# Patient Record
Sex: Female | Born: 1960 | Race: White | Hispanic: No | Marital: Married | Smoking: Former smoker
Health system: Southern US, Community
[De-identification: ages and names within clinical notes are randomized; demographics above are authoritative.]

## PROBLEM LIST (undated history)

## (undated) DIAGNOSIS — E039 Hypothyroidism, unspecified: Secondary | ICD-10-CM

## (undated) DIAGNOSIS — D649 Anemia, unspecified: Secondary | ICD-10-CM

## (undated) DIAGNOSIS — F419 Anxiety disorder, unspecified: Secondary | ICD-10-CM

## (undated) DIAGNOSIS — K649 Unspecified hemorrhoids: Secondary | ICD-10-CM

## (undated) DIAGNOSIS — I10 Essential (primary) hypertension: Secondary | ICD-10-CM

## (undated) DIAGNOSIS — E785 Hyperlipidemia, unspecified: Secondary | ICD-10-CM

## (undated) HISTORY — PX: OTHER SURGICAL HISTORY: SHX169

---

## 2000-08-19 ENCOUNTER — Ambulatory Visit (HOSPITAL_COMMUNITY): Admission: RE | Admit: 2000-08-19 | Discharge: 2000-08-19 | Payer: Self-pay | Admitting: Obstetrics and Gynecology

## 2000-08-19 ENCOUNTER — Encounter: Payer: Self-pay | Admitting: Obstetrics and Gynecology

## 2001-08-27 ENCOUNTER — Encounter: Payer: Self-pay | Admitting: Obstetrics and Gynecology

## 2001-08-27 ENCOUNTER — Ambulatory Visit (HOSPITAL_COMMUNITY): Admission: RE | Admit: 2001-08-27 | Discharge: 2001-08-27 | Payer: Self-pay | Admitting: Obstetrics and Gynecology

## 2002-09-15 ENCOUNTER — Ambulatory Visit (HOSPITAL_COMMUNITY): Admission: RE | Admit: 2002-09-15 | Discharge: 2002-09-15 | Payer: Self-pay | Admitting: Obstetrics & Gynecology

## 2002-09-15 ENCOUNTER — Encounter: Payer: Self-pay | Admitting: Obstetrics and Gynecology

## 2003-09-29 ENCOUNTER — Ambulatory Visit (HOSPITAL_COMMUNITY): Admission: RE | Admit: 2003-09-29 | Discharge: 2003-09-29 | Payer: Self-pay | Admitting: Obstetrics and Gynecology

## 2004-10-08 ENCOUNTER — Ambulatory Visit (HOSPITAL_COMMUNITY): Admission: RE | Admit: 2004-10-08 | Discharge: 2004-10-08 | Payer: Self-pay | Admitting: Obstetrics and Gynecology

## 2005-10-10 ENCOUNTER — Ambulatory Visit (HOSPITAL_COMMUNITY): Admission: RE | Admit: 2005-10-10 | Discharge: 2005-10-10 | Payer: Self-pay | Admitting: Obstetrics and Gynecology

## 2006-10-15 ENCOUNTER — Ambulatory Visit (HOSPITAL_COMMUNITY): Admission: RE | Admit: 2006-10-15 | Discharge: 2006-10-15 | Payer: Self-pay | Admitting: Family Medicine

## 2006-12-31 ENCOUNTER — Ambulatory Visit (HOSPITAL_COMMUNITY): Admission: RE | Admit: 2006-12-31 | Discharge: 2006-12-31 | Payer: Self-pay | Admitting: Family Medicine

## 2007-04-10 ENCOUNTER — Ambulatory Visit (HOSPITAL_COMMUNITY): Admission: RE | Admit: 2007-04-10 | Discharge: 2007-04-10 | Payer: Self-pay | Admitting: Family Medicine

## 2007-10-19 ENCOUNTER — Ambulatory Visit (HOSPITAL_COMMUNITY): Admission: RE | Admit: 2007-10-19 | Discharge: 2007-10-19 | Payer: Self-pay | Admitting: Family Medicine

## 2008-10-19 ENCOUNTER — Ambulatory Visit (HOSPITAL_COMMUNITY): Admission: RE | Admit: 2008-10-19 | Discharge: 2008-10-19 | Payer: Self-pay | Admitting: Family Medicine

## 2009-10-23 ENCOUNTER — Ambulatory Visit (HOSPITAL_COMMUNITY): Admission: RE | Admit: 2009-10-23 | Discharge: 2009-10-23 | Payer: Self-pay | Admitting: Family Medicine

## 2009-11-01 HISTORY — PX: COLONOSCOPY: SHX5424

## 2010-12-23 ENCOUNTER — Encounter: Payer: Self-pay | Admitting: Family Medicine

## 2011-01-09 ENCOUNTER — Other Ambulatory Visit (HOSPITAL_COMMUNITY): Payer: Self-pay | Admitting: Family Medicine

## 2011-01-09 DIAGNOSIS — Z1231 Encounter for screening mammogram for malignant neoplasm of breast: Secondary | ICD-10-CM

## 2011-02-14 ENCOUNTER — Ambulatory Visit (HOSPITAL_COMMUNITY)
Admission: RE | Admit: 2011-02-14 | Discharge: 2011-02-14 | Disposition: A | Discharge status: Home / Self Care | Payer: 59 | Source: Ambulatory Visit | Attending: Family Medicine | Admitting: Family Medicine

## 2011-02-14 DIAGNOSIS — Z1231 Encounter for screening mammogram for malignant neoplasm of breast: Secondary | ICD-10-CM | POA: Insufficient documentation

## 2011-02-15 ENCOUNTER — Other Ambulatory Visit: Payer: Self-pay | Admitting: Family Medicine

## 2011-02-15 DIAGNOSIS — R928 Other abnormal and inconclusive findings on diagnostic imaging of breast: Secondary | ICD-10-CM

## 2011-02-18 ENCOUNTER — Ambulatory Visit (HOSPITAL_COMMUNITY): Payer: Self-pay

## 2011-02-22 ENCOUNTER — Ambulatory Visit
Admission: RE | Admit: 2011-02-22 | Discharge: 2011-02-22 | Disposition: A | Discharge status: Home / Self Care | Payer: 59 | Source: Ambulatory Visit | Attending: Family Medicine | Admitting: Family Medicine

## 2011-02-22 DIAGNOSIS — R928 Other abnormal and inconclusive findings on diagnostic imaging of breast: Secondary | ICD-10-CM

## 2012-03-04 ENCOUNTER — Other Ambulatory Visit: Payer: Self-pay | Admitting: Family Medicine

## 2012-03-04 DIAGNOSIS — Z1231 Encounter for screening mammogram for malignant neoplasm of breast: Secondary | ICD-10-CM

## 2012-03-16 ENCOUNTER — Ambulatory Visit
Admission: RE | Admit: 2012-03-16 | Discharge: 2012-03-16 | Disposition: A | Discharge status: Home / Self Care | Payer: 59 | Source: Ambulatory Visit | Attending: Family Medicine | Admitting: Family Medicine

## 2012-03-16 DIAGNOSIS — Z1231 Encounter for screening mammogram for malignant neoplasm of breast: Secondary | ICD-10-CM

## 2013-02-12 ENCOUNTER — Other Ambulatory Visit: Payer: Self-pay

## 2013-02-12 DIAGNOSIS — Z1231 Encounter for screening mammogram for malignant neoplasm of breast: Secondary | ICD-10-CM

## 2013-03-17 ENCOUNTER — Ambulatory Visit
Admission: RE | Admit: 2013-03-17 | Discharge: 2013-03-17 | Disposition: A | Discharge status: Home / Self Care | Payer: 59 | Source: Ambulatory Visit

## 2013-03-17 DIAGNOSIS — Z1231 Encounter for screening mammogram for malignant neoplasm of breast: Secondary | ICD-10-CM

## 2014-02-15 ENCOUNTER — Other Ambulatory Visit: Payer: Self-pay

## 2014-02-15 DIAGNOSIS — Z1231 Encounter for screening mammogram for malignant neoplasm of breast: Secondary | ICD-10-CM

## 2014-03-18 ENCOUNTER — Ambulatory Visit
Admission: RE | Admit: 2014-03-18 | Discharge: 2014-03-18 | Disposition: A | Discharge status: Home / Self Care | Payer: 59 | Source: Ambulatory Visit

## 2014-03-18 DIAGNOSIS — Z1231 Encounter for screening mammogram for malignant neoplasm of breast: Secondary | ICD-10-CM

## 2015-02-15 ENCOUNTER — Other Ambulatory Visit: Payer: Self-pay

## 2015-02-15 DIAGNOSIS — Z1231 Encounter for screening mammogram for malignant neoplasm of breast: Secondary | ICD-10-CM

## 2015-03-29 ENCOUNTER — Ambulatory Visit
Admission: RE | Admit: 2015-03-29 | Discharge: 2015-03-29 | Disposition: A | Discharge status: Home / Self Care | Payer: 59 | Source: Ambulatory Visit

## 2015-03-29 DIAGNOSIS — Z1231 Encounter for screening mammogram for malignant neoplasm of breast: Secondary | ICD-10-CM

## 2016-02-27 ENCOUNTER — Other Ambulatory Visit: Payer: Self-pay

## 2016-02-27 DIAGNOSIS — Z1231 Encounter for screening mammogram for malignant neoplasm of breast: Secondary | ICD-10-CM

## 2016-04-02 ENCOUNTER — Ambulatory Visit
Admission: RE | Admit: 2016-04-02 | Discharge: 2016-04-02 | Disposition: A | Discharge status: Home / Self Care | Payer: 59 | Source: Ambulatory Visit

## 2016-04-02 DIAGNOSIS — Z1231 Encounter for screening mammogram for malignant neoplasm of breast: Secondary | ICD-10-CM

## 2017-03-12 ENCOUNTER — Other Ambulatory Visit: Payer: Self-pay | Admitting: Family Medicine

## 2017-03-12 DIAGNOSIS — Z1231 Encounter for screening mammogram for malignant neoplasm of breast: Secondary | ICD-10-CM

## 2017-04-09 ENCOUNTER — Ambulatory Visit
Admission: RE | Admit: 2017-04-09 | Discharge: 2017-04-09 | Disposition: A | Discharge status: Home / Self Care | Payer: PRIVATE HEALTH INSURANCE | Source: Ambulatory Visit | Attending: Family Medicine | Admitting: Family Medicine

## 2017-04-09 DIAGNOSIS — Z1231 Encounter for screening mammogram for malignant neoplasm of breast: Secondary | ICD-10-CM

## 2018-03-11 ENCOUNTER — Other Ambulatory Visit: Payer: Self-pay | Admitting: Family Medicine

## 2018-03-11 DIAGNOSIS — Z1231 Encounter for screening mammogram for malignant neoplasm of breast: Secondary | ICD-10-CM

## 2018-04-10 ENCOUNTER — Ambulatory Visit
Admission: RE | Admit: 2018-04-10 | Discharge: 2018-04-10 | Disposition: A | Discharge status: Home / Self Care | Payer: PRIVATE HEALTH INSURANCE | Source: Ambulatory Visit | Attending: Family Medicine | Admitting: Family Medicine

## 2018-04-10 DIAGNOSIS — Z1231 Encounter for screening mammogram for malignant neoplasm of breast: Secondary | ICD-10-CM

## 2019-04-05 ENCOUNTER — Other Ambulatory Visit: Payer: Self-pay | Admitting: Family Medicine

## 2019-04-05 DIAGNOSIS — Z1231 Encounter for screening mammogram for malignant neoplasm of breast: Secondary | ICD-10-CM

## 2019-06-16 ENCOUNTER — Other Ambulatory Visit: Payer: Self-pay

## 2019-06-16 ENCOUNTER — Ambulatory Visit
Admission: RE | Admit: 2019-06-16 | Discharge: 2019-06-16 | Disposition: A | Discharge status: Home / Self Care | Payer: PRIVATE HEALTH INSURANCE | Source: Ambulatory Visit | Attending: Family Medicine | Admitting: Family Medicine

## 2019-06-16 DIAGNOSIS — Z1231 Encounter for screening mammogram for malignant neoplasm of breast: Secondary | ICD-10-CM

## 2020-05-11 ENCOUNTER — Other Ambulatory Visit: Payer: Self-pay

## 2020-05-11 ENCOUNTER — Ambulatory Visit
Admission: RE | Admit: 2020-05-11 | Discharge: 2020-05-11 | Disposition: A | Discharge status: Home / Self Care | Payer: PRIVATE HEALTH INSURANCE | Source: Ambulatory Visit | Attending: Sports Medicine | Admitting: Sports Medicine

## 2020-05-11 ENCOUNTER — Other Ambulatory Visit: Payer: Self-pay | Admitting: Sports Medicine

## 2020-05-11 DIAGNOSIS — M25532 Pain in left wrist: Secondary | ICD-10-CM

## 2020-05-12 ENCOUNTER — Other Ambulatory Visit (HOSPITAL_COMMUNITY)
Admission: RE | Admit: 2020-05-12 | Discharge: 2020-05-12 | Disposition: A | Discharge status: Home / Self Care | Payer: PRIVATE HEALTH INSURANCE | Source: Ambulatory Visit | Attending: Orthopaedic Surgery | Admitting: Orthopaedic Surgery

## 2020-05-12 ENCOUNTER — Other Ambulatory Visit: Payer: Self-pay

## 2020-05-12 ENCOUNTER — Encounter (HOSPITAL_COMMUNITY): Payer: Self-pay | Admitting: Orthopaedic Surgery

## 2020-05-12 DIAGNOSIS — Z01812 Encounter for preprocedural laboratory examination: Secondary | ICD-10-CM | POA: Diagnosis not present

## 2020-05-12 DIAGNOSIS — Z20822 Contact with and (suspected) exposure to covid-19: Secondary | ICD-10-CM | POA: Insufficient documentation

## 2020-05-12 LAB — SARS CORONAVIRUS 2 (TAT 6-24 HRS): SARS Coronavirus 2: NEGATIVE

## 2020-05-12 NOTE — Progress Notes (Addendum)
Patient denies shortness of breath, fever, cough or chest pain.  PCP - Lenox Ahr, PA-C Cardiologist - n/a  Chest x-ray - n/a EKG - 05/13/20 Stress Test - n/a ECHO - n/a Cardiac Cath - n/a  ERAS: Clears til 6:45 am, no drink.  STOP now taking any Aspirin (unless otherwise instructed by your surgeon), Aleve, Naproxen, Ibuprofen, Motrin, Advil, Goody's, BC's, all herbal medications, fish oil, and all vitamins.   Coronavirus Screening Covid test scheduled 05/12/20 Do you have any of the following symptoms:  Cough yes/no: No Fever (>100.50F)  yes/no: No Runny nose yes/no: No Sore throat yes/no: No Difficulty breathing/shortness of breath  yes/no: No  Have you traveled in the last 14 days and where? yes/no: No  Patient verbalized understanding of instructions that were given via phone.

## 2020-05-13 ENCOUNTER — Ambulatory Visit (HOSPITAL_COMMUNITY)
Admission: RE | Admit: 2020-05-13 | Discharge: 2020-05-13 | Disposition: A | Discharge status: Home / Self Care | Payer: PRIVATE HEALTH INSURANCE | Attending: Orthopaedic Surgery | Admitting: Orthopaedic Surgery

## 2020-05-13 ENCOUNTER — Encounter (HOSPITAL_COMMUNITY)
Admission: RE | Disposition: A | Discharge status: Home / Self Care | Payer: Self-pay | Source: Home / Self Care | Attending: Orthopaedic Surgery

## 2020-05-13 ENCOUNTER — Ambulatory Visit (HOSPITAL_COMMUNITY): Payer: PRIVATE HEALTH INSURANCE | Admitting: Certified Registered"

## 2020-05-13 ENCOUNTER — Encounter (HOSPITAL_COMMUNITY): Payer: Self-pay | Admitting: Orthopaedic Surgery

## 2020-05-13 ENCOUNTER — Ambulatory Visit (HOSPITAL_COMMUNITY): Payer: PRIVATE HEALTH INSURANCE

## 2020-05-13 DIAGNOSIS — S52562A Barton's fracture of left radius, initial encounter for closed fracture: Secondary | ICD-10-CM | POA: Diagnosis not present

## 2020-05-13 DIAGNOSIS — F419 Anxiety disorder, unspecified: Secondary | ICD-10-CM | POA: Diagnosis not present

## 2020-05-13 DIAGNOSIS — E785 Hyperlipidemia, unspecified: Secondary | ICD-10-CM | POA: Insufficient documentation

## 2020-05-13 DIAGNOSIS — I10 Essential (primary) hypertension: Secondary | ICD-10-CM | POA: Diagnosis not present

## 2020-05-13 DIAGNOSIS — E039 Hypothyroidism, unspecified: Secondary | ICD-10-CM | POA: Insufficient documentation

## 2020-05-13 DIAGNOSIS — Z79890 Hormone replacement therapy (postmenopausal): Secondary | ICD-10-CM | POA: Insufficient documentation

## 2020-05-13 DIAGNOSIS — Z79899 Other long term (current) drug therapy: Secondary | ICD-10-CM | POA: Diagnosis not present

## 2020-05-13 DIAGNOSIS — W19XXXA Unspecified fall, initial encounter: Secondary | ICD-10-CM | POA: Diagnosis not present

## 2020-05-13 DIAGNOSIS — Z87891 Personal history of nicotine dependence: Secondary | ICD-10-CM | POA: Diagnosis not present

## 2020-05-13 HISTORY — DX: Anxiety disorder, unspecified: F41.9

## 2020-05-13 HISTORY — PX: ORIF WRIST FRACTURE: SHX2133

## 2020-05-13 HISTORY — DX: Essential (primary) hypertension: I10

## 2020-05-13 HISTORY — DX: Hypothyroidism, unspecified: E03.9

## 2020-05-13 HISTORY — DX: Unspecified hemorrhoids: K64.9

## 2020-05-13 HISTORY — DX: Hyperlipidemia, unspecified: E78.5

## 2020-05-13 HISTORY — DX: Anemia, unspecified: D64.9

## 2020-05-13 LAB — COMPREHENSIVE METABOLIC PANEL WITH GFR
ALT: 15 U/L (ref 0–44)
AST: 20 U/L (ref 15–41)
Albumin: 4 g/dL (ref 3.5–5.0)
Alkaline Phosphatase: 92 U/L (ref 38–126)
Anion gap: 11 (ref 5–15)
BUN: 16 mg/dL (ref 8–23)
CO2: 24 mmol/L (ref 22–32)
Calcium: 9.3 mg/dL (ref 8.9–10.3)
Chloride: 101 mmol/L (ref 98–111)
Creatinine, Ser: 1.1 mg/dL — ABNORMAL HIGH (ref 0.44–1.00)
GFR calc Af Amer: >60 mL/min
GFR calc non Af Amer: 54 mL/min — ABNORMAL LOW
Glucose, Bld: 104 mg/dL — ABNORMAL HIGH (ref 70–99)
Potassium: 3.5 mmol/L (ref 3.5–5.1)
Sodium: 136 mmol/L (ref 135–145)
Total Bilirubin: 0.7 mg/dL (ref 0.3–1.2)
Total Protein: 6.4 g/dL — ABNORMAL LOW (ref 6.5–8.1)

## 2020-05-13 LAB — CBC
HCT: 33.6 % — ABNORMAL LOW (ref 36.0–46.0)
Hemoglobin: 10.9 g/dL — ABNORMAL LOW (ref 12.0–15.0)
MCH: 32.1 pg (ref 26.0–34.0)
MCHC: 32.4 g/dL (ref 30.0–36.0)
MCV: 98.8 fL (ref 80.0–100.0)
Platelets: 257 K/uL (ref 150–400)
RBC: 3.4 MIL/uL — ABNORMAL LOW (ref 3.87–5.11)
RDW: 11.6 % (ref 11.5–15.5)
WBC: 6.7 K/uL (ref 4.0–10.5)
nRBC: 0 % (ref 0.0–0.2)

## 2020-05-13 SURGERY — OPEN REDUCTION INTERNAL FIXATION (ORIF) WRIST FRACTURE
Anesthesia: Monitor Anesthesia Care | Site: Wrist | Laterality: Left

## 2020-05-13 MED ORDER — ONDANSETRON HCL 4 MG/2ML IJ SOLN
INTRAMUSCULAR | Status: DC | PRN
Start: 2020-05-13 — End: 2020-05-13
  Administered 2020-05-13: 4 mg via INTRAVENOUS

## 2020-05-13 MED ORDER — PHENYLEPHRINE 40 MCG/ML (10ML) SYRINGE FOR IV PUSH (FOR BLOOD PRESSURE SUPPORT)
PREFILLED_SYRINGE | INTRAVENOUS | Status: DC | PRN
  Administered 2020-05-13 (×6): 40 ug via INTRAVENOUS

## 2020-05-13 MED ORDER — ORAL CARE MOUTH RINSE: 15.0000 mL | Freq: Once | OROMUCOSAL | Status: AC

## 2020-05-13 MED ORDER — MIDAZOLAM HCL (PF) 2 MG/2ML IJ SOLN: 2.0000 mg | Freq: Once | INTRAMUSCULAR | Status: AC

## 2020-05-13 MED ORDER — FENTANYL CITRATE (PF) 100 MCG/2ML IJ SOLN: 100.0000 ug | Freq: Once | INTRAMUSCULAR | Status: AC

## 2020-05-13 MED ORDER — MIDAZOLAM HCL 5 MG/5ML IJ SOLN
INTRAMUSCULAR | Status: DC | PRN
Start: 2020-05-13 — End: 2020-05-13
  Administered 2020-05-13: .5 mg via INTRAVENOUS
  Administered 2020-05-13: 1 mg via INTRAVENOUS
  Administered 2020-05-13: .5 mg via INTRAVENOUS

## 2020-05-13 MED ORDER — DEXAMETHASONE SODIUM PHOSPHATE 10 MG/ML IJ SOLN
INTRAMUSCULAR | Status: DC | PRN
Start: 2020-05-13 — End: 2020-05-13
  Administered 2020-05-13: 4 mg via INTRAVENOUS

## 2020-05-13 MED ORDER — CHLORHEXIDINE GLUCONATE 0.12 % MT SOLN: 15.0000 mL | Freq: Once | OROMUCOSAL | Status: AC

## 2020-05-13 MED ORDER — CEFAZOLIN SODIUM-DEXTROSE 2-4 GM/100ML-% IV SOLN
INTRAVENOUS | Status: AC
  Filled 2020-05-13: qty 100

## 2020-05-13 MED ORDER — LACTATED RINGERS IV SOLN: INTRAVENOUS | Status: DC

## 2020-05-13 MED ORDER — POVIDONE-IODINE 10 % EX SWAB: 2.0000 "application " | Freq: Once | CUTANEOUS | Status: DC

## 2020-05-13 MED ORDER — FENTANYL CITRATE (PF) 250 MCG/5ML IJ SOLN
INTRAMUSCULAR | Status: AC
  Filled 2020-05-13: qty 5

## 2020-05-13 MED ORDER — 0.9 % SODIUM CHLORIDE (POUR BTL) OPTIME
TOPICAL | Status: DC | PRN
  Administered 2020-05-13: 1000 mL

## 2020-05-13 MED ORDER — PHENYLEPHRINE 40 MCG/ML (10ML) SYRINGE FOR IV PUSH (FOR BLOOD PRESSURE SUPPORT)
PREFILLED_SYRINGE | INTRAVENOUS | Status: AC
  Filled 2020-05-13: qty 10

## 2020-05-13 MED ORDER — CHLORHEXIDINE GLUCONATE 4 % EX LIQD: 60.0000 mL | Freq: Once | CUTANEOUS | Status: DC

## 2020-05-13 MED ORDER — MIDAZOLAM HCL 2 MG/2ML IJ SOLN
INTRAMUSCULAR | Status: AC
  Administered 2020-05-13: 2 mg via INTRAVENOUS
  Filled 2020-05-13: qty 2

## 2020-05-13 MED ORDER — MIDAZOLAM HCL 2 MG/2ML IJ SOLN
INTRAMUSCULAR | Status: AC
  Filled 2020-05-13: qty 2

## 2020-05-13 MED ORDER — PROPOFOL 500 MG/50ML IV EMUL
INTRAVENOUS | Status: DC | PRN
Start: 2020-05-13 — End: 2020-05-13
  Administered 2020-05-13: 100 ug/kg/min via INTRAVENOUS

## 2020-05-13 MED ORDER — DEXAMETHASONE SODIUM PHOSPHATE 10 MG/ML IJ SOLN
INTRAMUSCULAR | Status: AC
  Filled 2020-05-13: qty 1

## 2020-05-13 MED ORDER — ONDANSETRON HCL 4 MG/2ML IJ SOLN
INTRAMUSCULAR | Status: AC
  Filled 2020-05-13: qty 2

## 2020-05-13 MED ORDER — CEFAZOLIN SODIUM-DEXTROSE 2-4 GM/100ML-% IV SOLN
2.0000 g | INTRAVENOUS | Status: AC
  Administered 2020-05-13: 2 g via INTRAVENOUS

## 2020-05-13 MED ORDER — CHLORHEXIDINE GLUCONATE 0.12 % MT SOLN
OROMUCOSAL | Status: AC
  Administered 2020-05-13: 15 mL via OROMUCOSAL
  Filled 2020-05-13: qty 15

## 2020-05-13 MED ORDER — FENTANYL CITRATE (PF) 100 MCG/2ML IJ SOLN
INTRAMUSCULAR | Status: AC
  Administered 2020-05-13: 100 ug via INTRAVENOUS
  Filled 2020-05-13: qty 2

## 2020-05-13 SURGICAL SUPPLY — 60 items
ALCOHOL 70% 16 OZ (MISCELLANEOUS) ×4 IMPLANT
APL PRP STRL LF DISP 70% ISPRP (MISCELLANEOUS) ×1
BIT DRILL 2.0 LNG QUCK RELEASE (BIT) IMPLANT
BIT DRILL 2.8 QUICK RELEASE (BIT) IMPLANT
BNDG CMPR 9X4 STRL LF SNTH (GAUZE/BANDAGES/DRESSINGS) ×1
BNDG ELASTIC 3X5.8 VLCR STR LF (GAUZE/BANDAGES/DRESSINGS) ×3 IMPLANT
BNDG ELASTIC 4X5.8 VLCR STR LF (GAUZE/BANDAGES/DRESSINGS) ×3 IMPLANT
BNDG ESMARK 4X9 LF (GAUZE/BANDAGES/DRESSINGS) ×3 IMPLANT
BNDG GAUZE ELAST 4 BULKY (GAUZE/BANDAGES/DRESSINGS) ×3 IMPLANT
CANISTER SUCT 3000ML PPV (MISCELLANEOUS) ×3 IMPLANT
CHLORAPREP W/TINT 26 (MISCELLANEOUS) ×3 IMPLANT
CORD BIPOLAR FORCEPS 12FT (ELECTRODE) ×3 IMPLANT
COVER SURGICAL LIGHT HANDLE (MISCELLANEOUS) ×3 IMPLANT
COVER WAND RF STERILE (DRAPES) IMPLANT
CUFF TOURN SGL QUICK 18X4 (TOURNIQUET CUFF) ×3 IMPLANT
CUFF TOURN SGL QUICK 24 (TOURNIQUET CUFF)
CUFF TRNQT CYL 24X4X16.5-23 (TOURNIQUET CUFF) IMPLANT
DRAPE OEC MINIVIEW 54X84 (DRAPES) ×3 IMPLANT
DRAPE SURG 17X23 STRL (DRAPES) ×3 IMPLANT
DRILL 2.0 LNG QUICK RELEASE (BIT) ×3
DRILL 2.8 QUICK RELEASE (BIT) ×3
GAUZE SPONGE 4X4 12PLY STRL (GAUZE/BANDAGES/DRESSINGS) ×3 IMPLANT
GAUZE SPONGE 4X4 12PLY STRL LF (GAUZE/BANDAGES/DRESSINGS) ×2 IMPLANT
GAUZE XEROFORM 1X8 LF (GAUZE/BANDAGES/DRESSINGS) ×2 IMPLANT
GLOVE INDICATOR 8.0 STRL GRN (GLOVE) ×3 IMPLANT
GLOVE SURG SYN 7.5  E (GLOVE) ×3
GLOVE SURG SYN 7.5 E (GLOVE) ×1 IMPLANT
GLOVE SURG SYN 7.5 PF PI (GLOVE) ×1 IMPLANT
GOWN STRL REUS W/ TWL LRG LVL3 (GOWN DISPOSABLE) ×2 IMPLANT
GOWN STRL REUS W/TWL LRG LVL3 (GOWN DISPOSABLE) ×6
GUIDEWIRE ORTHO 0.054X6 (WIRE) ×4 IMPLANT
KIT BASIN OR (CUSTOM PROCEDURE TRAY) ×3 IMPLANT
KIT TURNOVER KIT B (KITS) ×3 IMPLANT
NEEDLE 22X1 1/2 (OR ONLY) (NEEDLE) IMPLANT
NS IRRIG 1000ML POUR BTL (IV SOLUTION) ×3 IMPLANT
PACK ORTHO EXTREMITY (CUSTOM PROCEDURE TRAY) ×3 IMPLANT
PAD ARMBOARD 7.5X6 YLW CONV (MISCELLANEOUS) ×6 IMPLANT
PAD CAST 3X4 CTTN HI CHSV (CAST SUPPLIES) ×1 IMPLANT
PAD CAST 4YDX4 CTTN HI CHSV (CAST SUPPLIES) ×1 IMPLANT
PADDING CAST COTTON 3X4 STRL (CAST SUPPLIES) ×3
PADDING CAST COTTON 4X4 STRL (CAST SUPPLIES) ×3
PLATE LEFT DIST RADIUS NARROW (Plate) ×2 IMPLANT
SCREW BN FT 16X2.3XLCK HEX CRT (Screw) IMPLANT
SCREW CORT FT 18X2.3XLCK HEX (Screw) IMPLANT
SCREW CORTICAL LOCKING 2.3X16M (Screw) ×3 IMPLANT
SCREW CORTICAL LOCKING 2.3X18M (Screw) ×12 IMPLANT
SCREW LOCK 12X3.5X HEXALOBE (Screw) IMPLANT
SCREW LOCKING 3.5X12 (Screw) ×3 IMPLANT
SCREW NON LOCK 3.5X10MM (Screw) ×2 IMPLANT
SCREW NON TOGG 2.3X22MM (Screw) ×2 IMPLANT
SPLINT FIBERGLASS 3X12 (CAST SUPPLIES) ×2 IMPLANT
SUT PROLENE 4 0 PS 2 18 (SUTURE) ×2 IMPLANT
SUT VIC AB 3-0 PS2 18 (SUTURE) IMPLANT
SYR CONTROL 10ML LL (SYRINGE) IMPLANT
TOWEL GREEN STERILE (TOWEL DISPOSABLE) ×3 IMPLANT
TOWEL GREEN STERILE FF (TOWEL DISPOSABLE) ×3 IMPLANT
TUBE CONNECTING 12'X1/4 (SUCTIONS) ×1
TUBE CONNECTING 12X1/4 (SUCTIONS) ×2 IMPLANT
UNDERPAD 30X36 HEAVY ABSORB (UNDERPADS AND DIAPERS) ×3 IMPLANT
WATER STERILE IRR 1000ML POUR (IV SOLUTION) ×3 IMPLANT

## 2020-05-13 NOTE — H&P (Signed)
ORTHOPAEDIC H&P  PCP:  Aletha Halim., PA-C  Chief Complaint: Left distal radius fracture  HPI: Beverly Diaz is a 59 y.o. female who complains of left distal radius fracture.  Earlier this week she fell onto the left side and had immediate pain and swelling.  She was seen by my partner Dr. Delilah Shan earlier this week who found a mildly displaced intra-articular left distal radius fracture.  She underwent a CT scan of the left wrist and was then seen by me in clinic yesterday.  The CT scan showed a volar Barton's fracture with diastases of the intra-articular fracture line and volar translation of the carpus.  We had a long discussion regarding treatment options including both operative and nonoperative management.  After having this discussion the patient did wish to proceed forward with surgical intervention and presents today for that.  She denies numbness or tingling in the fingers.  Her pain is been well controlled.  She is remained n.p.o.  Past Medical History:  Diagnosis Date  . Anemia   . Anxiety   . Hemorrhoids   . HLD (hyperlipidemia)   . Hypertension   . Hypothyroidism    Past Surgical History:  Procedure Laterality Date  . COLONOSCOPY  11/2009   anal fissure and hemorrhoids  . wisdom teeth ext     Social History   Socioeconomic History  . Marital status: Married    Spouse name: Not on file  . Number of children: Not on file  . Years of education: Not on file  . Highest education level: Not on file  Occupational History  . Not on file  Tobacco Use  . Smoking status: Former Smoker    Types: Cigarettes  . Smokeless tobacco: Never Used  . Tobacco comment: quit at age 32 - weekend smoker  Vaping Use  . Vaping Use: Never used  Substance and Sexual Activity  . Alcohol use: Yes    Alcohol/week: 21.0 standard drinks    Types: 21 Glasses of wine per week  . Drug use: Never  . Sexual activity: Yes    Birth control/protection: Post-menopausal  Other Topics  Concern  . Not on file  Social History Narrative  . Not on file   Social Determinants of Health   Financial Resource Strain:   . Difficulty of Paying Living Expenses:   Food Insecurity:   . Worried About Charity fundraiser in the Last Year:   . Arboriculturist in the Last Year:   Transportation Needs:   . Film/video editor (Medical):   Marland Kitchen Lack of Transportation (Non-Medical):   Physical Activity:   . Days of Exercise per Week:   . Minutes of Exercise per Session:   Stress:   . Feeling of Stress :   Social Connections:   . Frequency of Communication with Friends and Family:   . Frequency of Social Gatherings with Friends and Family:   . Attends Religious Services:   . Active Member of Clubs or Organizations:   . Attends Archivist Meetings:   Marland Kitchen Marital Status:    History reviewed. No pertinent family history. No Known Allergies Prior to Admission medications   Medication Sig Start Date End Date Taking? Authorizing Provider  levothyroxine (SYNTHROID) 25 MCG tablet Take 25 mcg by mouth daily. 04/11/20  Yes [provider]  LORazepam (ATIVAN) 0.5 MG tablet Take 0.25 mg by mouth daily as needed for anxiety.   Yes [provider]  losartan (COZAAR) 25 MG tablet Take 25 mg by mouth daily.   Yes [provider]  metroNIDAZOLE (METROCREAM) 0.75 % cream Apply 1 application topically daily as needed (Rosacea).   Yes [provider]  Multiple Vitamins-Minerals (MULTIVITAMIN WITH MINERALS) tablet Take 1 tablet by mouth daily.   Yes [provider]  rosuvastatin (CRESTOR) 20 MG tablet Take 20 mg by mouth daily.   Yes [provider]  HYDROcodone-acetaminophen (NORCO/VICODIN) 5-325 MG tablet Take 1 tablet by mouth every 6 (six) hours as needed for moderate pain.    [provider]   CT WRIST LEFT WO CONTRAST  Result Date: 05/11/2020 CLINICAL DATA:  Left wrist fracture. EXAM: CT OF THE LEFT WRIST WITHOUT CONTRAST  TECHNIQUE: Multidetector CT imaging was performed according to the standard protocol. Multiplanar CT image reconstructions were also generated. COMPARISON:  None. FINDINGS: Bones/Joint/Cartilage Acute intra-articular fracture of the distal radius with 2 mm volar displacement, 83mm impaction, and 1 mm articular surface step-off. No additional fracture. No dislocation. 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 Small radiocarpal joint effusion. Ligaments Ligaments are suboptimally evaluated by CT. Muscles and Tendons Grossly intact.  No muscle atrophy. Soft tissue Mild soft tissue swelling. No fluid collection or hematoma. No soft tissue mass. IMPRESSION: 1. Acute minimally displaced and impacted intra-articular fracture of the distal radius. 2. 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 Electronically Signed   By: Obie Dredge M.D.   On: 05/11/2020 09:14    Positive ROS: All other systems have been reviewed and were otherwise negative with the exception of those mentioned in the HPI and as above.  Physical Exam: General: Alert, no acute distress Cardiovascular: No pedal edema Respiratory: No cyanosis, no use of accessory musculature Skin: No lesions in the area of chief complaint Psychiatric: Patient is competent for consent with normal mood and affect  MUSCULOSKELETAL: Examination of left upper extremity shows a intact volar wrist splint. There is mild swelling to exposed digits as well as to the hand deep to the splint. She has tenderness palpation through the splint to the distal radius. No tenderness to the distal ulna. Her finger range of motion is limited by the splint but she does have gentle flexion extension of the fingers. Her fingertips are all warm well perfused with brisk capillary refill. She does have intact sensation throughout all fingertips.  Assessment: #1 left distal radius volar Barton's fracture  Plan: Plan  to proceed forward with open reduction and internal fixation of the left distal radius.  Risks, benefits and alternatives of the procedure were discussed with the patient once again.  These risks include but are not limited to infection, bleeding, damage to surrounding structures including blood vessels and nerves, pain, stiffness, malunion, nonunion, implant failure and need for additional procedures.  Informed consent was obtained that time.  The patient's left upper extremity was marked.  Plan for discharge home postoperatively with follow-up with therapy next week to transition her from her surgical dressing to a removable wrist splint.  Follow-up with me in approximate 10 to 14 days.    Ernest Mallick, MD [phone]   05/13/2020 7:31 AM

## 2020-05-13 NOTE — Anesthesia Preprocedure Evaluation (Signed)
Anesthesia Evaluation  Patient identified by MRN, date of birth, ID band Patient awake    Reviewed: Allergy & Precautions, NPO status , Patient's Chart, lab work & pertinent test results  Airway Mallampati: II  TM Distance: >3 FB Neck ROM: Full    Dental  (+) Teeth Intact, Dental Advisory Given   Pulmonary former smoker,    breath sounds clear to auscultation       Cardiovascular hypertension,  Rhythm:Regular Rate:Normal     Neuro/Psych    GI/Hepatic   Endo/Other    Renal/GU      Musculoskeletal   Abdominal   Peds  Hematology   Anesthesia Other Findings   Reproductive/Obstetrics                             Anesthesia Physical Anesthesia Plan  ASA: II  Anesthesia Plan: MAC   Post-op Pain Management:  Regional for Post-op pain   Induction: Intravenous  PONV Risk Score and Plan: Ondansetron and Propofol infusion  Airway Management Planned: Natural Airway and Simple Face Mask  Additional Equipment:   Intra-op Plan:   Post-operative Plan:   Informed Consent: I have reviewed the patients History and Physical, chart, labs and discussed the procedure including the risks, benefits and alternatives for the proposed anesthesia with the patient or authorized representative who has indicated his/her understanding and acceptance.     Dental advisory given  Plan Discussed with: CRNA and Anesthesiologist  Anesthesia Plan Comments:         Anesthesia Quick Evaluation

## 2020-05-13 NOTE — Discharge Instructions (Signed)
Discharge Instructions  - Keep dressings in place. Do not remove them. - The dressings must stay dry - Take all medication as prescribed. Transition to over the counter pain medication as your pain improves - Keep the hand elevated over the next 48-72 hours to help with pain and swelling - Move all digits not restricted by the dressings regularly to prevent stiffness - You have a therapy appointment schedule on Thursday morning. Follow up with Dr. Roney Mans in 10-14 days for suture removal - Your pain medication have been sent digitally to your pharmacy

## 2020-05-13 NOTE — Progress Notes (Signed)
Dr. Noreene Larsson notified of BP

## 2020-05-13 NOTE — Anesthesia Procedure Notes (Signed)
Anesthesia Regional Block: Supraclavicular block   Pre-Anesthetic Checklist: ,, timeout performed, Correct Patient, Correct Site, Correct Laterality, Correct Procedure, Correct Position, site marked, Risks and benefits discussed,  Surgical consent,  Pre-op evaluation,  At surgeon's request and post-op pain management  Laterality: Left  Prep: chloraprep       Needles:  Injection technique: Single-shot  Needle Type: Stimulator Needle - 80          Additional Needles:   Procedures:, nerve stimulator,,,,,,,  Narrative:  Start time: 05/13/2020 9:10 AM End time: 05/13/2020 9:15 AM Injection made incrementally with aspirations every 5 mL.  Performed by: Personally  Anesthesiologist: Kipp Brood, MD  Additional Notes: 20 cc 0.75% Ropivacaine

## 2020-05-13 NOTE — Transfer of Care (Signed)
Immediate Anesthesia Transfer of Care Note  Patient: Beverly Diaz  Procedure(s) Performed: OPEN REDUCTION INTERNAL FIXATION (ORIF) left distal radius fracture (Left Wrist)  Patient Location: PACU  Anesthesia Type:MAC and Regional  Level of Consciousness: awake, alert , oriented and patient cooperative  Airway & Oxygen Therapy: Patient Spontanous Breathing  Post-op Assessment: Report given to RN and Post -op Vital signs reviewed and stable  Post vital signs: Reviewed and stable  Last Vitals:  Vitals Value Taken Time  BP    Temp    Pulse    Resp    SpO2      Last Pain:  Vitals:   05/13/20 0807  TempSrc:   PainSc: 0-No pain      Patients Stated Pain Goal: 3 (37/90/24 0973)  Complications: No complications documented.

## 2020-05-13 NOTE — Op Note (Signed)
PREOPERATIVE DIAGNOSIS: Left distal radius volar Barton's fracture  POSTOPERATIVE DIAGNOSIS: Same  ATTENDING PHYSICIAN: Maudry Mayhew. Jeannie Fend, III, MD who was present and scrubbed for the entire case   ASSISTANT SURGEON: None.   ANESTHESIA: Regional with MAC  SURGICAL PROCEDURES: 1.  Open reduction and internal fixation of left distal radius volar Barton fracture 2.  AP, lateral and fossa lateral intraoperative fluoroscopic images of the left wrist  SURGICAL INDICATIONS: Patient is a 59 year old female who was seen and evaluated by me in clinic.  Earlier this week she had a fall onto the left wrist and had immediate pain and swelling.  She was found to have a volar Barton's fracture which was mildly displaced.  A CT scan of the wrist was obtained which showed a approximately 2 mm of articular step-off as well as volar translation of the lunate and carpus with the large, volar articular piece.  We had a long discussion regarding treatment options including both operative and nonoperative management.  My concern with continued nonoperative management is the articular step-off as well as volar translation of the carpus.  Because of this we had a long discussion regarding surgical treatment.  Surgery would involve open reduction and internal fixation of her left wrist.  After discussing the risks and benefits of this she did wish to proceed forward with surgery and presents today for that.  FINDING: There was a volarly displaced, volar Barton fracture of the distal radius.  Anatomic reduction of the fracture was obtained and stable fixation occurred with volar locking plate and screw construct.  DESCRIPTION OF PROCEDURE: Patient was identified in the preop holding area where the risk benefits and alternatives of the procedure were once again discussed with the patient.  These include but are not limited to infection, bleeding, damage to surrounding structures including blood vessels and nerves, pain,  stiffness, malunion, nonunion, implant failure need for additional procedures.  Informed consent was obtained that time of the patient's left wrist was marked with a surgical marking pen.  She then underwent a left upper extremity plexus block by anesthesia.  She was brought to the operative suite where timeout was performed identifying the correct patient operative site.  She was positioned supine on the operative table with her hand outstretched on a hand table.  She was induced under MAC sedation.  Preoperative antibiotics were administered.  A tourniquet is placed on the upper arm the arm was then prepped and draped in usual sterile fashion.  The limb was exsanguinated and the tourniquet was inflated.  A standard FCR, volar approach was utilized.  A longitudinal incision was made over the FCR tendon.  Sharp dissection was continued out through the subcutaneous tissues.  15 blade was used to incise the FCR tendon sheath and she tendon was mobilized ulnarly.  The deep FCR fascia was then incised and the FPL tendon was mobilized ulnarly as well.  This revealed the pronator quadratus which was elevated off of the palmar cortex of the distal radius.  In doing so the volar Barton's fracture was identified.  There was cortical step-off along the volar distal radius.  A Valora Corporal was inserted into the fracture to help elevate and mobilize the articular piece.  Hematoma was debrided with a curette and rondure.  Once full mobilization of the fragment had been achieved, direct pressure on the fracture provided near-anatomic reduction based on the cortical keys along the volar distal radius.  At this point an Acumed, narrow distally fitting volar locking plate was  placed along the distal radius.  It was pinned in place both proximally and distally.  With direct pressure on the plate, and anatomic reduction of the fracture was once again able to be achieved under direct visualization of the volar cortical keys.  A nonlocking  screw was placed within the oblong hole of the plate proximally to provide compression of the plate.  AP and lateral fluoroscopic images were obtained and showed the plate was sitting slightly proximally.  The screw proximally was loosened and the plate was shifted distally to provide a good fit along the volar distal radius near the watershed line.  At this point the plate was found to be in appropriate position with near-anatomic reduction of the distal radius fracture.  Locking screws were placed within the distal row of the plate and into the radial styloid.  An additional cortical screw was placed proximally in the plate and a locking screw was placed in the most distal hole within the proximal portion of the plate.  At this point all K wires were removed and fluoroscopic images were again obtained.  This showed concentric reduction of the joint surface with appropriate positioning of the plate and screw construct volarly.  The wrist was taken through series of range of motion and found to have smooth flexion, extension, pronation supination with no crepitus and full motion.  The DRUJ was stable in both pronation and supination.  This point the wound was copiously irrigated with normal saline.  Skin was closed with interrupted 4-0 Prolene sutures.  Xeroform, 4 x 4's and a well-padded volar slab splint were then placed.  The tourniquet was released and the patient had return of brisk capillary refill to all of her digits.  She was awoken from her sedation and taken the PACU in stable condition.  She tolerated the procedure well and there were no complications.  RADIOGRAPHIC INTERPRETATION: AP, lateral and fossa lateral images were obtained intraoperative fluoroscopic images.  The show concentric reduction of the articular surface from the previously displaced, volar Barton's fracture.  Volar plate and screw construct is intact with appropriate screw lengths and plate positioning.  ESTIMATED BLOOD LOSS: 10  mL  TOURNIQUET TIME: 39 minutes  SPECIMENS: None  POSTOPERATIVE PLAN: Patient will be discharged home today.  She will follow-up with my therapist late next week for removal of her splint and fabrication of a custom-made volar slab splint.  I will plan to see her back in clinic in 10 to 14 days for removal of her sutures and repeat radiographs.  If her images look appropriate at that time, we can begin some early wrist range of motion exercises after her appointment with me.  IMPLANTS: Acumed volar locking plate, distally fitting, narrow sized

## 2020-05-14 NOTE — Anesthesia Postprocedure Evaluation (Signed)
Anesthesia Post Note  Patient: Beverly Diaz  Procedure(s) Performed: OPEN REDUCTION INTERNAL FIXATION (ORIF) left distal radius fracture (Left Wrist)     Patient location during evaluation: PACU Anesthesia Type: MAC Level of consciousness: awake and alert Pain management: pain level controlled Vital Signs Assessment: post-procedure vital signs reviewed and stable Respiratory status: spontaneous breathing, nonlabored ventilation, respiratory function stable and patient connected to nasal cannula oxygen Cardiovascular status: blood pressure returned to baseline and stable Postop Assessment: no apparent nausea or vomiting Anesthetic complications: no   No complications documented.  Last Vitals:  Vitals:   05/13/20 1100 05/13/20 1105  BP: 126/81 126/81  Pulse: 64 62  Resp: 15 13  Temp: 36.7 C 36.7 C  SpO2: 100% 100%    Last Pain:  Vitals:   05/13/20 1045  TempSrc:   PainSc: 0-No pain                 Carmine Carrozza COKER

## 2020-05-16 ENCOUNTER — Encounter (HOSPITAL_COMMUNITY): Payer: Self-pay | Admitting: Orthopaedic Surgery

## 2020-06-19 ENCOUNTER — Other Ambulatory Visit: Payer: Self-pay | Admitting: Family Medicine

## 2020-06-19 DIAGNOSIS — Z1231 Encounter for screening mammogram for malignant neoplasm of breast: Secondary | ICD-10-CM

## 2020-07-26 ENCOUNTER — Ambulatory Visit
Admission: RE | Admit: 2020-07-26 | Discharge: 2020-07-26 | Disposition: A | Discharge status: Home / Self Care | Payer: PRIVATE HEALTH INSURANCE | Source: Ambulatory Visit | Attending: Family Medicine | Admitting: Family Medicine

## 2020-07-26 ENCOUNTER — Other Ambulatory Visit: Payer: Self-pay

## 2020-07-26 DIAGNOSIS — Z1231 Encounter for screening mammogram for malignant neoplasm of breast: Secondary | ICD-10-CM

## 2020-12-24 IMAGING — MG DIGITAL SCREENING BILAT W/ TOMO W/ CAD
6 of 10 series · 6 of 30 positions shown · non-contrast
Comparison: Previous exam(s).

CLINICAL DATA: Screening.

EXAM:
DIGITAL SCREENING BILATERAL MAMMOGRAM WITH TOMO AND CAD

[R CC synth-2D]
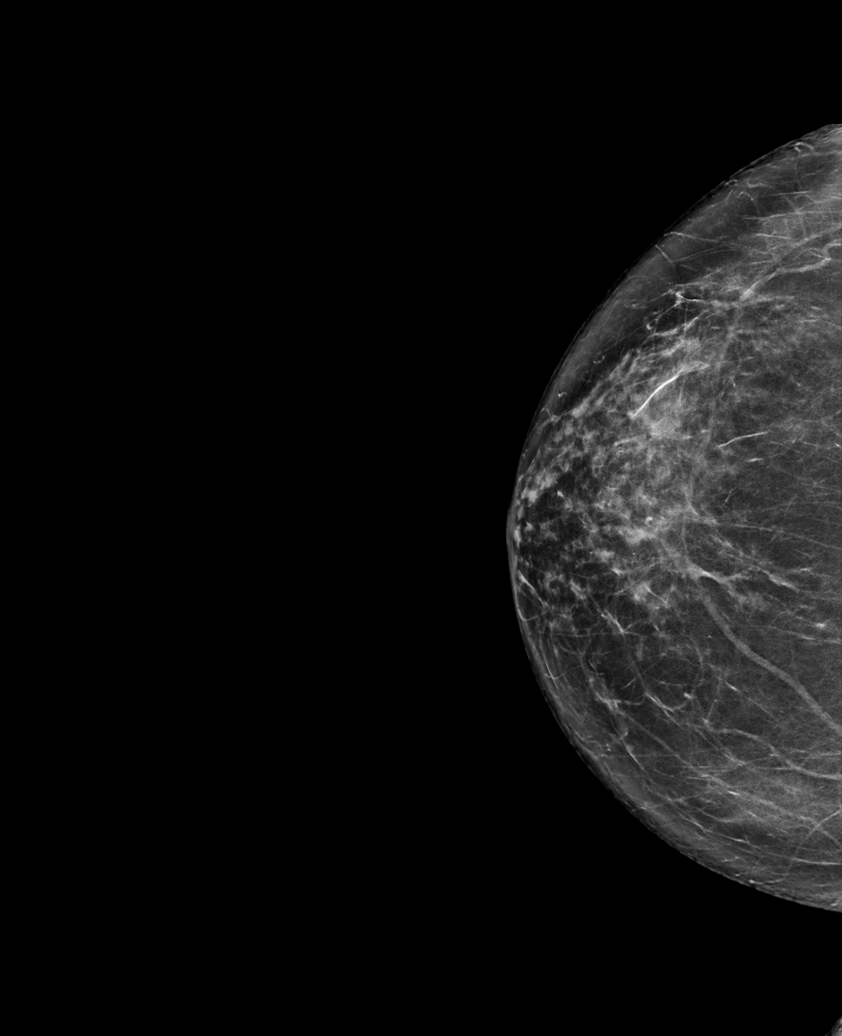

[L MLO synth-2D]
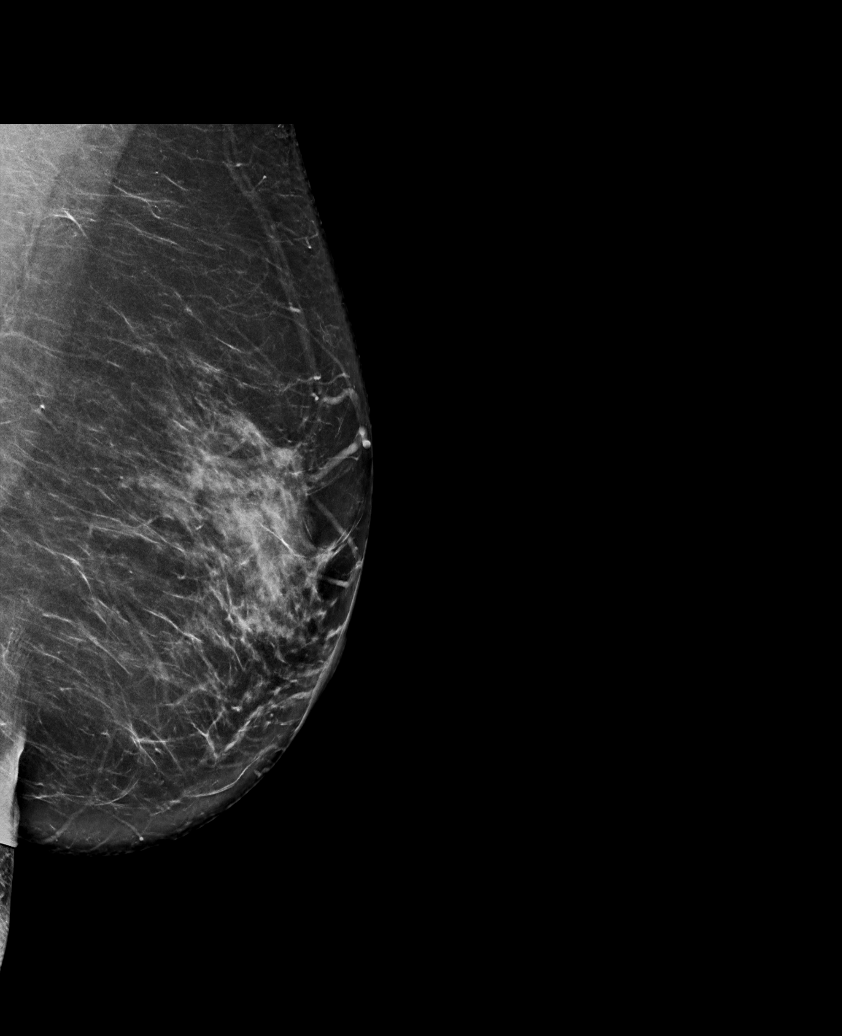

[L CC synth-2D]
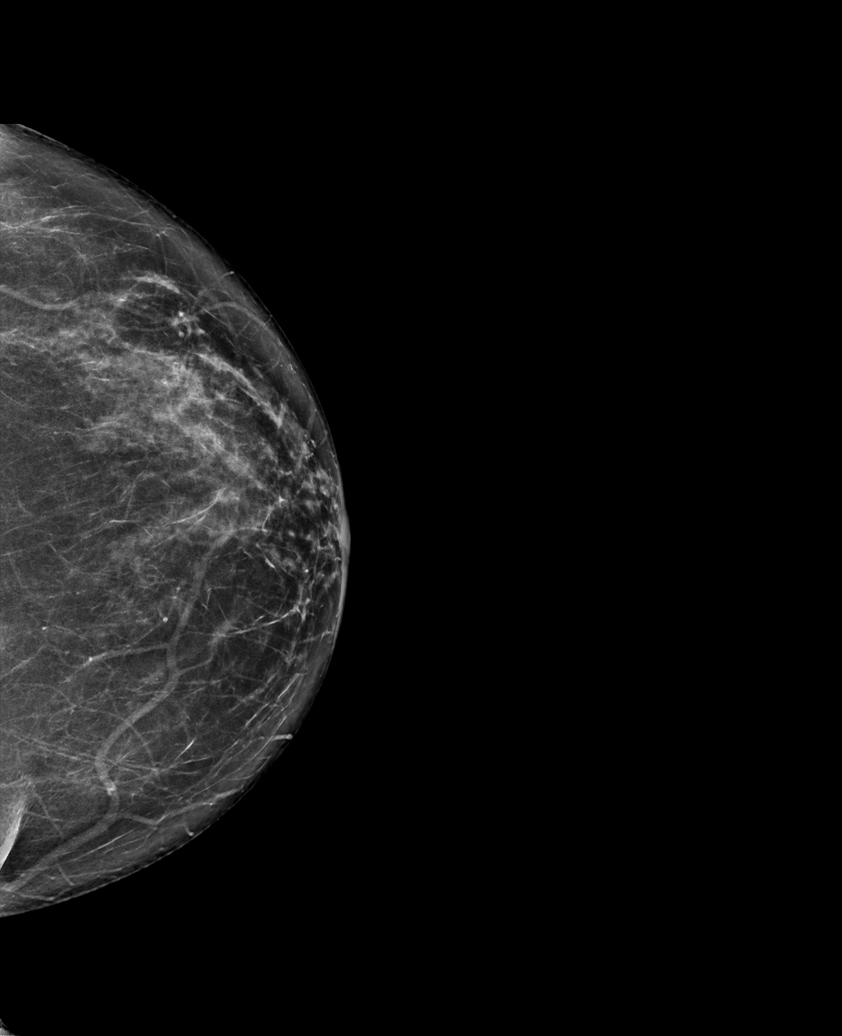

[R MLO synth-2D (1 of 2)]
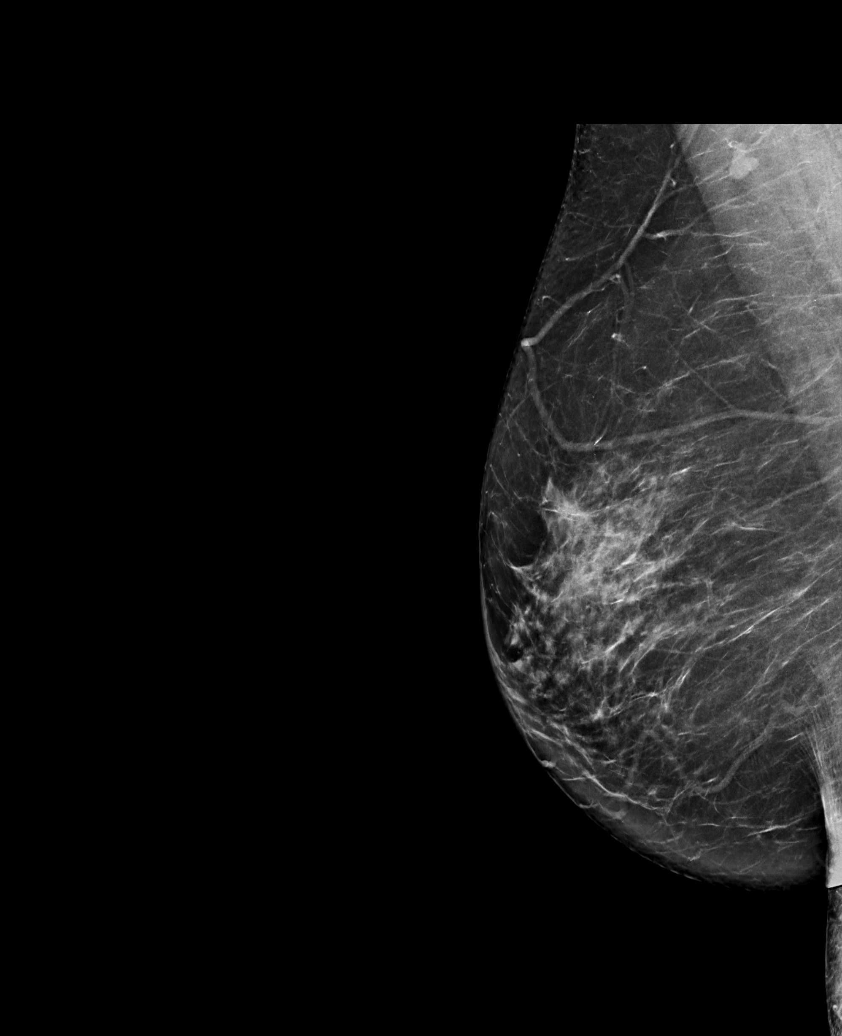

[R MLO synth-2D (2 of 2)]
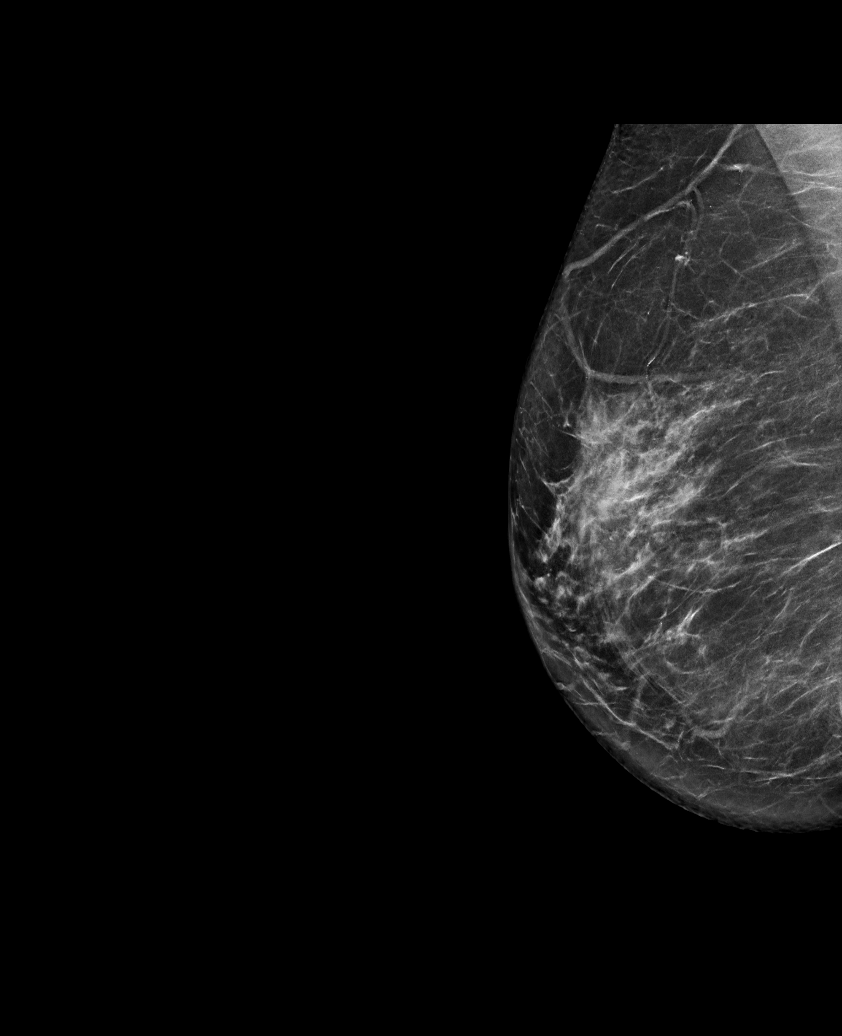

[R MLO tomo · tomo slice 41/82.0]
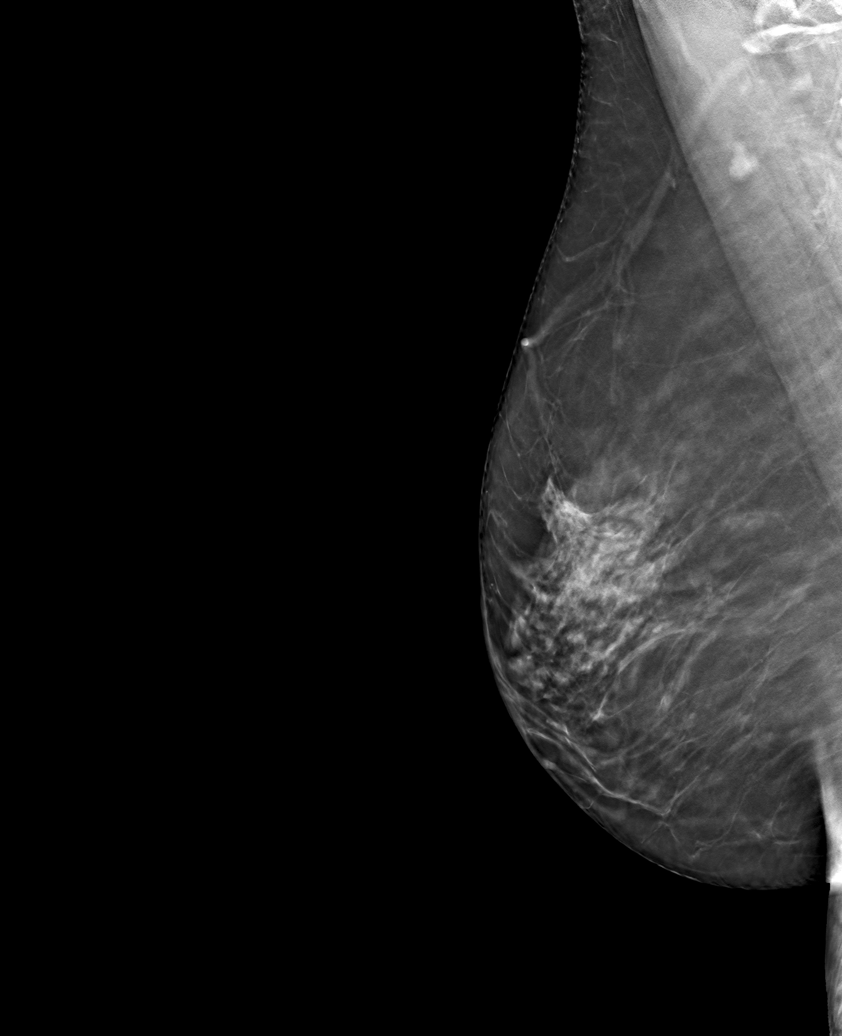

[6 of 30 positions shown; findings below may reference images not displayed]

ACR Breast Density Category c: The breast tissue is heterogeneously
dense, which may obscure small masses.
FINDINGS: There are no findings suspicious for malignancy. Images were
processed with CAD.
IMPRESSION: No mammographic evidence of malignancy. A result letter of this
screening mammogram will be mailed directly to the patient.

RECOMMENDATION:
Screening mammogram in one year. (Code:[id number]-U-LHB)

BI-RADS CATEGORY  1: Negative.

## 2021-06-28 ENCOUNTER — Other Ambulatory Visit: Payer: Self-pay | Admitting: Family Medicine

## 2021-06-28 DIAGNOSIS — Z1231 Encounter for screening mammogram for malignant neoplasm of breast: Secondary | ICD-10-CM

## 2021-08-17 ENCOUNTER — Other Ambulatory Visit: Payer: Self-pay

## 2021-08-17 ENCOUNTER — Ambulatory Visit
Admission: RE | Admit: 2021-08-17 | Discharge: 2021-08-17 | Disposition: A | Discharge status: Home / Self Care | Payer: 59 | Source: Ambulatory Visit | Attending: Family Medicine | Admitting: Family Medicine

## 2021-08-17 DIAGNOSIS — Z1231 Encounter for screening mammogram for malignant neoplasm of breast: Secondary | ICD-10-CM

## 2022-01-24 ENCOUNTER — Other Ambulatory Visit: Payer: Self-pay | Admitting: Family Medicine

## 2022-01-24 DIAGNOSIS — Z124 Encounter for screening for malignant neoplasm of cervix: Secondary | ICD-10-CM | POA: Diagnosis not present

## 2022-01-24 DIAGNOSIS — Z0000 Encounter for general adult medical examination without abnormal findings: Secondary | ICD-10-CM | POA: Diagnosis not present

## 2022-01-24 DIAGNOSIS — Z1231 Encounter for screening mammogram for malignant neoplasm of breast: Secondary | ICD-10-CM

## 2022-01-24 DIAGNOSIS — E2839 Other primary ovarian failure: Secondary | ICD-10-CM

## 2022-02-01 DIAGNOSIS — Z13220 Encounter for screening for lipoid disorders: Secondary | ICD-10-CM | POA: Diagnosis not present

## 2022-02-01 DIAGNOSIS — Z1329 Encounter for screening for other suspected endocrine disorder: Secondary | ICD-10-CM | POA: Diagnosis not present

## 2022-02-01 DIAGNOSIS — Z0000 Encounter for general adult medical examination without abnormal findings: Secondary | ICD-10-CM | POA: Diagnosis not present

## 2022-04-09 DIAGNOSIS — R790 Abnormal level of blood mineral: Secondary | ICD-10-CM | POA: Diagnosis not present

## 2022-07-24 DIAGNOSIS — E782 Mixed hyperlipidemia: Secondary | ICD-10-CM | POA: Diagnosis not present

## 2022-07-24 DIAGNOSIS — R69 Illness, unspecified: Secondary | ICD-10-CM | POA: Diagnosis not present

## 2022-07-24 DIAGNOSIS — I10 Essential (primary) hypertension: Secondary | ICD-10-CM | POA: Diagnosis not present

## 2022-07-24 DIAGNOSIS — E039 Hypothyroidism, unspecified: Secondary | ICD-10-CM | POA: Diagnosis not present

## 2022-08-19 ENCOUNTER — Ambulatory Visit
Admission: RE | Admit: 2022-08-19 | Discharge: 2022-08-19 | Disposition: A | Discharge status: Home / Self Care | Payer: Self-pay | Source: Ambulatory Visit | Attending: Family Medicine | Admitting: Family Medicine

## 2022-08-19 ENCOUNTER — Ambulatory Visit
Admission: RE | Admit: 2022-08-19 | Discharge: 2022-08-19 | Disposition: A | Discharge status: Home / Self Care | Payer: 59 | Source: Ambulatory Visit | Attending: Family Medicine | Admitting: Family Medicine

## 2022-08-19 DIAGNOSIS — E2839 Other primary ovarian failure: Secondary | ICD-10-CM

## 2022-08-19 DIAGNOSIS — Z1231 Encounter for screening mammogram for malignant neoplasm of breast: Secondary | ICD-10-CM

## 2023-01-10 DIAGNOSIS — Z8249 Family history of ischemic heart disease and other diseases of the circulatory system: Secondary | ICD-10-CM | POA: Diagnosis not present

## 2023-01-10 DIAGNOSIS — H269 Unspecified cataract: Secondary | ICD-10-CM | POA: Diagnosis not present

## 2023-01-10 DIAGNOSIS — I10 Essential (primary) hypertension: Secondary | ICD-10-CM | POA: Diagnosis not present

## 2023-01-10 DIAGNOSIS — E785 Hyperlipidemia, unspecified: Secondary | ICD-10-CM | POA: Diagnosis not present

## 2023-01-10 DIAGNOSIS — Z823 Family history of stroke: Secondary | ICD-10-CM | POA: Diagnosis not present

## 2023-01-10 DIAGNOSIS — Z809 Family history of malignant neoplasm, unspecified: Secondary | ICD-10-CM | POA: Diagnosis not present

## 2023-01-10 DIAGNOSIS — Z008 Encounter for other general examination: Secondary | ICD-10-CM | POA: Diagnosis not present

## 2023-01-10 DIAGNOSIS — E039 Hypothyroidism, unspecified: Secondary | ICD-10-CM | POA: Diagnosis not present

## 2023-01-10 DIAGNOSIS — L309 Dermatitis, unspecified: Secondary | ICD-10-CM | POA: Diagnosis not present

## 2023-01-10 DIAGNOSIS — R69 Illness, unspecified: Secondary | ICD-10-CM | POA: Diagnosis not present

## 2023-01-24 DIAGNOSIS — I10 Essential (primary) hypertension: Secondary | ICD-10-CM | POA: Diagnosis not present

## 2023-01-24 DIAGNOSIS — E039 Hypothyroidism, unspecified: Secondary | ICD-10-CM | POA: Diagnosis not present

## 2023-01-24 DIAGNOSIS — Z0000 Encounter for general adult medical examination without abnormal findings: Secondary | ICD-10-CM | POA: Diagnosis not present

## 2023-01-24 DIAGNOSIS — E782 Mixed hyperlipidemia: Secondary | ICD-10-CM | POA: Diagnosis not present

## 2023-01-27 DIAGNOSIS — E039 Hypothyroidism, unspecified: Secondary | ICD-10-CM | POA: Diagnosis not present

## 2023-01-27 DIAGNOSIS — E782 Mixed hyperlipidemia: Secondary | ICD-10-CM | POA: Diagnosis not present

## 2023-01-27 DIAGNOSIS — I10 Essential (primary) hypertension: Secondary | ICD-10-CM | POA: Diagnosis not present

## 2023-01-27 DIAGNOSIS — Z0000 Encounter for general adult medical examination without abnormal findings: Secondary | ICD-10-CM | POA: Diagnosis not present

## 2023-01-27 DIAGNOSIS — E538 Deficiency of other specified B group vitamins: Secondary | ICD-10-CM | POA: Diagnosis not present

## 2023-07-25 ENCOUNTER — Other Ambulatory Visit: Payer: Self-pay | Admitting: Family Medicine

## 2023-07-25 DIAGNOSIS — Z1231 Encounter for screening mammogram for malignant neoplasm of breast: Secondary | ICD-10-CM

## 2023-08-21 ENCOUNTER — Ambulatory Visit: Payer: 59

## 2023-09-12 ENCOUNTER — Ambulatory Visit
Admission: RE | Admit: 2023-09-12 | Discharge: 2023-09-12 | Disposition: A | Discharge status: Home / Self Care | Payer: 59 | Source: Ambulatory Visit | Attending: Family Medicine | Admitting: Family Medicine

## 2023-09-12 DIAGNOSIS — Z1231 Encounter for screening mammogram for malignant neoplasm of breast: Secondary | ICD-10-CM

## 2024-07-30 ENCOUNTER — Other Ambulatory Visit: Payer: Self-pay | Admitting: Family Medicine

## 2024-07-30 DIAGNOSIS — Z1231 Encounter for screening mammogram for malignant neoplasm of breast: Secondary | ICD-10-CM

## 2024-09-17 ENCOUNTER — Ambulatory Visit
Admission: RE | Admit: 2024-09-17 | Discharge: 2024-09-17 | Disposition: A | Discharge status: Home / Self Care | Source: Ambulatory Visit | Attending: Family Medicine | Admitting: Family Medicine

## 2024-09-17 DIAGNOSIS — Z1231 Encounter for screening mammogram for malignant neoplasm of breast: Secondary | ICD-10-CM
